# Patient Record
Sex: Male | Born: 1975 | Race: Black or African American | Hispanic: No | Marital: Married | Smoking: Never smoker
Health system: Southern US, Community
[De-identification: ages and names within clinical notes are randomized; demographics above are authoritative.]

## PROBLEM LIST (undated history)

## (undated) HISTORY — PX: NO PAST SURGERIES: SHX2092

## (undated) HISTORY — PX: INCISION AND DRAINAGE: SHX5863

---

## 2004-07-13 ENCOUNTER — Emergency Department: Payer: Self-pay | Admitting: Unknown Physician Specialty

## 2005-02-16 ENCOUNTER — Emergency Department: Payer: Self-pay | Admitting: Internal Medicine

## 2005-02-16 ENCOUNTER — Other Ambulatory Visit: Payer: Self-pay

## 2005-04-26 ENCOUNTER — Emergency Department: Payer: Self-pay | Admitting: Emergency Medicine

## 2010-08-03 ENCOUNTER — Ambulatory Visit: Payer: Self-pay | Admitting: Family Medicine

## 2014-02-25 ENCOUNTER — Ambulatory Visit (INDEPENDENT_AMBULATORY_CARE_PROVIDER_SITE_OTHER): Payer: Managed Care, Other (non HMO)

## 2014-02-25 ENCOUNTER — Ambulatory Visit (INDEPENDENT_AMBULATORY_CARE_PROVIDER_SITE_OTHER): Payer: Managed Care, Other (non HMO) | Admitting: Podiatry

## 2014-02-25 ENCOUNTER — Encounter: Payer: Self-pay | Admitting: Podiatry

## 2014-02-25 VITALS — BP 105/71 | HR 62 | Resp 16 | Ht 72.0 in | Wt 225.0 lb

## 2014-02-25 DIAGNOSIS — M7660 Achilles tendinitis, unspecified leg: Secondary | ICD-10-CM

## 2014-02-25 DIAGNOSIS — M7661 Achilles tendinitis, right leg: Secondary | ICD-10-CM

## 2014-02-25 MED ORDER — MELOXICAM 15 MG PO TABS
15.0000 mg | ORAL_TABLET | Freq: Every day | ORAL | Status: DC
Start: 2014-02-25 — End: 2017-07-05

## 2014-02-25 NOTE — Progress Notes (Signed)
   Subjective:    Patient ID: Dean Henderson, male    DOB: Dec 02, 1975, 38 y.o.   MRN: [id number]  HPI Comments: Its my right foot, it hurts up the back. The bottom does not hurt. Its been hurting for 1 month. Its about the same. It only hurts when i stretch. i am a runner but it does not hurt when running. i rest it and my wife will massage it for me.  Foot Pain      Review of Systems  Musculoskeletal:       Joint pain  All other systems reviewed and are negative.      Objective:   Physical Exam: I have reviewed his past medical history medications allergies surgeries social history and review of systems. Pulses are strongly palpable bilateral. Neurologic sensorium is intact per since once the monofilament. Deep tendon reflexes are intact bilateral muscle strength is 5 over 5 dorsiflexors plantar flexors inverters everters all intrinsic musculature is intact. The evaluation demonstrates all joints distal to the ankle a full range of motion without crepitation. He has pain on palpation to the Achilles tendon insertion site on the plantar lateral aspect of the right heel. Cutaneous evaluation demonstrates supple well hydrated cutis no erythema edema saline is drainage or odor. Radiographic evaluation does demonstrate a posterior probably oriented calcaneal heel spur.  Assessment: Tendo Achilles insertional tendinitis.  Plan: Discussed etiology pathology conservative versus surgical therapies. At this point I injected just lateral to his area of pain to his right foot. With dexamethasone and local anesthetic. Placed him in a night splint discussed appropriate shoe gear stretching exercises ice therapy and shoe gear modifications. He was scanned for a pair orthotics. Also wrote her prescription for Medrol Dosepak to be followed by meloxicam        Assessment & Plan:

## 2014-02-25 NOTE — Patient Instructions (Signed)

## 2014-03-04 ENCOUNTER — Encounter: Payer: Self-pay | Admitting: Podiatry

## 2014-03-04 NOTE — Progress Notes (Signed)
Patients orthotics have arrived

## 2014-03-05 ENCOUNTER — Telehealth: Payer: Self-pay | Admitting: Podiatry

## 2014-03-05 NOTE — Telephone Encounter (Signed)
Left message for Conny to schedule an appointment to pick up orthotics, also mailed postcard.

## 2014-03-18 ENCOUNTER — Ambulatory Visit: Payer: Managed Care, Other (non HMO) | Admitting: Podiatry

## 2014-03-25 ENCOUNTER — Ambulatory Visit (INDEPENDENT_AMBULATORY_CARE_PROVIDER_SITE_OTHER): Payer: Managed Care, Other (non HMO) | Admitting: Podiatry

## 2014-03-25 ENCOUNTER — Encounter: Payer: Self-pay | Admitting: Podiatry

## 2014-03-25 DIAGNOSIS — M7661 Achilles tendinitis, right leg: Secondary | ICD-10-CM

## 2014-03-25 DIAGNOSIS — M7660 Achilles tendinitis, unspecified leg: Secondary | ICD-10-CM

## 2014-03-25 NOTE — Progress Notes (Signed)
Pt presents for orthotic pick up ,written and verbal instructions are given  

## 2014-03-25 NOTE — Patient Instructions (Signed)

## 2014-12-30 DIAGNOSIS — E669 Obesity, unspecified: Secondary | ICD-10-CM | POA: Insufficient documentation

## 2015-10-20 LAB — HM HIV SCREENING LAB: HM HIV SCREENING: NEGATIVE

## 2017-07-04 ENCOUNTER — Encounter: Payer: Self-pay | Admitting: Physician Assistant

## 2017-07-04 ENCOUNTER — Ambulatory Visit: Payer: Commercial Managed Care - PPO | Admitting: Physician Assistant

## 2017-07-04 VITALS — BP 112/64 | HR 64 | Temp 98.6°F | Resp 16 | Ht 72.0 in | Wt 233.0 lb

## 2017-07-04 DIAGNOSIS — Z0000 Encounter for general adult medical examination without abnormal findings: Secondary | ICD-10-CM | POA: Diagnosis not present

## 2017-07-04 DIAGNOSIS — Z13220 Encounter for screening for lipoid disorders: Secondary | ICD-10-CM

## 2017-07-04 DIAGNOSIS — Z1329 Encounter for screening for other suspected endocrine disorder: Secondary | ICD-10-CM | POA: Diagnosis not present

## 2017-07-04 DIAGNOSIS — M7542 Impingement syndrome of left shoulder: Secondary | ICD-10-CM | POA: Diagnosis not present

## 2017-07-04 DIAGNOSIS — Z131 Encounter for screening for diabetes mellitus: Secondary | ICD-10-CM | POA: Diagnosis not present

## 2017-07-04 DIAGNOSIS — M19012 Primary osteoarthritis, left shoulder: Secondary | ICD-10-CM | POA: Diagnosis not present

## 2017-07-04 DIAGNOSIS — M952 Other acquired deformity of head: Secondary | ICD-10-CM

## 2017-07-04 MED ORDER — DICLOFENAC SODIUM 1 % TD GEL: 2.0000 g | Freq: Four times a day (QID) | TRANSDERMAL | 0 refills | Status: DC | PRN

## 2017-07-04 NOTE — Patient Instructions (Signed)

## 2017-07-04 NOTE — Progress Notes (Signed)
Patient: Dean Henderson, Male    DOB: October 25, 1975, 41 y.o.   MRN: [id number] Visit Date: 07/05/2017  Today's Provider: Trinna Post, PA-C   Chief Complaint  Patient presents with  . Establish Care  . Annual Exam   Subjective:    Annual physical exam Dean Henderson is a 41 y.o. male who presents today for health maintenance and complete physical.  Pt reports having left shoulder and hip pain.  Also right knee pain.  He feels fairly well. He reports exercising regularly. He reports he is sleeping fairly well.  He lives in [place], [place]. He has four children ages 38, 5, 30 and 64 who are doing well. He has been married the past 9 years. He has never smoked, doesn't use alcohol, doesn't use drugs. Exercises frequently with cardio. Owns a Administrator, [employer].   Declines flu shot.  He has multiple complaints related to joint pain and issues. He has been seen by orthopedics for right knee pain which was felt to be arthritis and probable tendinitis.   Reports pain in left shoulder, right fourth finger, and left hip. Has left shoulder pain when lifting arm. Has right fourth finger pain when holding his clippers. Has left hip pain when he gets up from crouching position.   He also has lump on his left forehead. Denies any head trauma to the area. Says it has been there for ten years. It has not grown much in size. Does not change sizes, get red, or leak fluid. Said a previous provider told him he could have it "filed down." It bothers him.  -----------------------------------------------------------------   Review of Systems  Constitutional: Negative.   HENT: Negative.   Eyes: Negative.   Respiratory: Negative.   Cardiovascular: Negative.   Gastrointestinal: Negative.   Endocrine: Negative.   Genitourinary: Negative.   Musculoskeletal: Negative.   Skin: Negative.   Allergic/Immunologic: Negative.   Neurological: Negative.   Hematological: Negative.   Psychiatric/Behavioral:  Negative.     Social History      He  reports that  has never smoked. he has never used smokeless tobacco. He reports that he does not drink alcohol or use drugs.       Social History   Socioeconomic History  . Marital status: Married    Spouse name: None  . Number of children: None  . Years of education: None  . Highest education level: None  Social Needs  . Financial resource strain: None  . Food insecurity - worry: None  . Food insecurity - inability: None  . Transportation needs - medical: None  . Transportation needs - non-medical: None  Occupational History  . None  Tobacco Use  . Smoking status: Never Smoker  . Smokeless tobacco: Never Used  Substance and Sexual Activity  . Alcohol use: No    Frequency: Never  . Drug use: No  . Sexual activity: None  Other Topics Concern  . None  Social History Narrative  . None    No past medical history on file.   Patient Active Problem List   Diagnosis Date Noted  . Annual physical exam 07/04/2017  . Screening cholesterol level 07/04/2017    Past Surgical History:  Procedure Laterality Date  . NO PAST SURGERIES      Family History        Family Status  Relation Name Status  . Mother  Alive  . Father  Alive  . PGM  (Not Specified)  His family history includes Diabetes in his father; Healthy in his mother; Hypertension in his father; Throat cancer in his paternal grandmother.     Allergies  Allergen Reactions  . Pork-Derived Products   . Tea      Current Outpatient Medications:  .  diclofenac sodium (VOLTAREN) 1 % GEL, Apply 2 g 4 (four) times daily as needed topically., Disp: 100 g, Rfl: 0   Patient Care Team: Paulene Floor as PCP - General (Physician Assistant)      Objective:   Vitals: BP 112/64 (BP Location: Left Arm, Patient Position: Sitting, Cuff Size: Large)   Pulse 64   Temp 98.6 F (37 C) (Oral)   Resp 16   Ht 6' (1.829 m)   Wt 233 lb (105.7 kg)   BMI 31.60 kg/m     Vitals:   07/04/17 1415  BP: 112/64  Pulse: 64  Resp: 16  Temp: 98.6 F (37 C)  TempSrc: Oral  Weight: 233 lb (105.7 kg)  Height: 6' (1.829 m)     Physical Exam  Constitutional: He is oriented to person, place, and time. He appears well-developed and well-nourished.  HENT:  Right Ear: Tympanic membrane and external ear normal.  Left Ear: Tympanic membrane and external ear normal.  Nose: Nose normal.  Mouth/Throat: Oropharynx is clear and moist. No oropharyngeal exudate.  Eyes: Conjunctivae are normal.  Neck: Neck supple.  Cardiovascular: Normal rate and regular rhythm.  Pulmonary/Chest: Effort normal and breath sounds normal.  Abdominal: Soft. Bowel sounds are normal.  Musculoskeletal:  Shoulders normal strength bilaterally. L shoulder with some pain on Neer's test. Right fourth finger with some tenderness to palpation. No swelling or bogginess in digits.   Lymphadenopathy:    He has no cervical adenopathy.  Neurological: He is alert and oriented to person, place, and time.  Skin: Skin is warm and dry.  There is a small 1 cm in diameter lump on left aspect of forehead. It is not fluctuant like a cyst or lipoma. It is hard and immobile, feels like bone.   Psychiatric: He has a normal mood and affect. His behavior is normal.     Depression Screen No flowsheet data found.    Assessment & Plan:     Routine Health Maintenance and Physical Exam  Exercise Activities and Dietary recommendations Goals    None      Immunization History  Administered Date(s) Administered  . Tdap 06/24/2008    Health Maintenance  Topic Date Due  . HIV Screening  04/13/1992  . INFLUENZA VACCINE  11/20/2017 (Originally 03/23/2017)  . TETANUS/TDAP  06/24/2018     Discussed health benefits of physical activity, and encouraged him to engage in regular exercise appropriate for his age and condition.    1. Annual physical exam  - CBC with Differential - Ambulatory referral to  General Surgery  2. Screening cholesterol level  - Lipid Profile  3. Diabetes mellitus screening  - Comprehensive Metabolic Panel (CMET)  4. Thyroid disorder screening  - TSH  5. Impingement syndrome of left shoulder  Printed out exercises.   6. Osteoarthritis of left shoulder, unspecified osteoarthritis type  Many of his complaints consistent with arthritis. Talked about Tylenol, low impact physical activity like biking, swimming, yoga. Voltaren gel worked well for him in the past. Use PRN.   - diclofenac sodium (VOLTAREN) 1 % GEL; Apply 2 g 4 (four) times daily as needed topically.  Dispense: 100 g; Refill: 0  7. Skull deformity  Unsure what this is, may be remodeling to forgotten past trauma. It appears to have been stable x 10 years but it concerns patient. Refer to surgery for further eval.  Return in about 1 year (around 07/04/2018) for CPE.  The entirety of the information documented in the History of Present Illness, Review of Systems and Physical Exam were personally obtained by me. Portions of this information were initially documented by April M. Sabra Heck, CMA and reviewed by me for thoroughness and accuracy.   --------------------------------------------------------------------    Trinna Post, PA-C  [facility]

## 2017-07-06 ENCOUNTER — Encounter: Payer: Self-pay | Admitting: Physician Assistant

## 2017-07-19 ENCOUNTER — Telehealth: Payer: Self-pay

## 2017-07-19 LAB — LIPID PANEL
Cholesterol: 136 mg/dL (ref <200)
HDL: 68 mg/dL (ref >40)
LDL Cholesterol (Calc): 57 mg/dL (calc)
Non-HDL Cholesterol (Calc): 68 mg/dL (calc) (ref <130)
Total CHOL/HDL Ratio: 2 (calc) (ref <5.0)
Triglycerides: 37 mg/dL (ref <150)

## 2017-07-19 LAB — CBC WITH DIFFERENTIAL/PLATELET
Basophils Absolute: 31 cells/uL (ref 0–200)
Basophils Relative: 0.4 %
Eosinophils Absolute: 77 cells/uL (ref 15–500)
Eosinophils Relative: 1 %
HCT: 43.4 % (ref 38.5–50.0)
Hemoglobin: 14.6 g/dL (ref 13.2–17.1)
Lymphs Abs: 2010 cells/uL (ref 850–3900)
MCH: 29.8 pg (ref 27.0–33.0)
MCHC: 33.6 g/dL (ref 32.0–36.0)
MCV: 88.6 fL (ref 80.0–100.0)
MPV: 10.3 fL (ref 7.5–12.5)
Monocytes Relative: 3.6 %
Neutro Abs: 5305 cells/uL (ref 1500–7800)
Neutrophils Relative %: 68.9 %
Platelets: 224 10*3/uL (ref 140–400)
RBC: 4.9 10*6/uL (ref 4.20–5.80)
RDW: 11.6 % (ref 11.0–15.0)
Total Lymphocyte: 26.1 %
WBC mixed population: 277 cells/uL (ref 200–950)
WBC: 7.7 10*3/uL (ref 3.8–10.8)

## 2017-07-19 LAB — COMPLETE METABOLIC PANEL WITH GFR
AG Ratio: 1.4 (calc) (ref 1.0–2.5)
ALT: 25 U/L (ref 9–46)
AST: 21 U/L (ref 10–40)
Albumin: 4.4 g/dL (ref 3.6–5.1)
Alkaline phosphatase (APISO): 58 U/L (ref 40–115)
BUN: 12 mg/dL (ref 7–25)
CO2: 31 mmol/L (ref 20–32)
Calcium: 9.1 mg/dL (ref 8.6–10.3)
Chloride: 103 mmol/L (ref 98–110)
Creat: 0.96 mg/dL (ref 0.60–1.35)
GFR, Est African American: 114 mL/min/{1.73_m2} (ref >=60)
GFR, Est Non African American: 98 mL/min/{1.73_m2} (ref >=60)
Globulin: 3.2 g/dL (calc) (ref 1.9–3.7)
Glucose, Bld: 94 mg/dL (ref 65–99)
Potassium: 4.6 mmol/L (ref 3.5–5.3)
Sodium: 138 mmol/L (ref 135–146)
Total Bilirubin: 1.5 mg/dL — ABNORMAL HIGH (ref 0.2–1.2)
Total Protein: 7.6 g/dL (ref 6.1–8.1)

## 2017-07-19 LAB — TSH: TSH: 1.06 mIU/L (ref 0.40–4.50)

## 2017-07-19 NOTE — Telephone Encounter (Signed)
LMTCB 07/19/2017  Thanks,   -Mickel Baas

## 2017-07-19 NOTE — Telephone Encounter (Signed)
-----   Message from Trinna Post, [place] sent at 07/19/2017 11:52 AM EST ----- Labwork all normal. Have filled out form and it is ready for pickup. Please have wife direct all further communication through clinic calls or MyChart.

## 2017-07-20 ENCOUNTER — Telehealth: Payer: Self-pay | Admitting: Physician Assistant

## 2017-07-20 NOTE — Telephone Encounter (Signed)
Spoke with Dean Henderson about his work form being ready to pick up. I have also informed him that the appropriate means of him and his wife contacting this clinic includes calling the phone line and MyChart. He expresses understanding. Says he will pick up the form later.

## 2017-07-20 NOTE — Telephone Encounter (Signed)
Pt advised.   Thanks,   -Dyonna Jaspers  

## 2017-08-01 ENCOUNTER — Ambulatory Visit: Payer: Commercial Managed Care - PPO | Admitting: Surgery

## 2017-09-05 ENCOUNTER — Encounter: Payer: Self-pay | Admitting: Surgery

## 2017-09-05 ENCOUNTER — Ambulatory Visit (INDEPENDENT_AMBULATORY_CARE_PROVIDER_SITE_OTHER): Payer: Commercial Managed Care - PPO | Admitting: Surgery

## 2017-09-05 VITALS — BP 118/74 | HR 60 | Temp 97.9°F | Ht 72.0 in | Wt 233.0 lb

## 2017-09-05 DIAGNOSIS — M898X8 Other specified disorders of bone, other site: Secondary | ICD-10-CM

## 2017-09-05 DIAGNOSIS — M899 Disorder of bone, unspecified: Secondary | ICD-10-CM | POA: Diagnosis not present

## 2017-09-05 NOTE — Patient Instructions (Addendum)
INSTRUCTIONS FOR MRI of Skull  Location: [facility]  Date and Time: Monday at 8:00AM Please arrive at 7:30AM check in at the Radiology Desk.  Instructions:  Nothing to eat or drink after midnight the day before your procedure. Bring a list of medications with you to your testing appointment.   If you need to change the appointment, please call central scheduling at [phone].

## 2017-09-05 NOTE — Progress Notes (Signed)
  Surgical Consultation  09/05/2017  Dean Henderson is an 42 y.o. male.   Chief Complaint  Patient presents with  . New Patient (Initial Visit)    new patient Skull deformity referred Dr. Carles Collet     HPI: Pt seen in consultation at the request of Mrs. Pollak PA for skull lesion. He reports she has had this frontal lesion for last 5-6 years. He does not recall any previous trauma. No pain, no significant growth. No symptoms related to the nodule. No neurological symptoms He Is able to perform more than 6 Mets of activity without any shortness of breath or chest pain. CBC and CMP reviewed and are nml. No images are  available.  History reviewed. No pertinent past medical history.  Past Surgical History:  Procedure Laterality Date  . NO PAST SURGERIES      Family History  Problem Relation Age of Onset  . Healthy Mother   . Diabetes Father   . Hypertension Father   . Throat cancer Paternal Grandmother   . Cancer Paternal Grandmother   . Cancer Paternal Uncle     Social History:  reports that  has never smoked. he has never used smokeless tobacco. He reports that he does not drink alcohol or use drugs.  Allergies:  Allergies  Allergen Reactions  . Pork-Derived Products   . Tea     Medications reviewed.     ROS Full ROS performed and is otherwise negative other than what is stated in the HPI    BP 118/74   Pulse 60   Temp 97.9 F (36.6 C) (Oral)   Ht 6' (1.829 m)   Wt 105.7 kg (233 lb)   BMI 31.60 kg/m   Physical Exam  Constitutional: He is oriented to person, place, and time and well-developed, well-nourished, and in no distress. No distress.  Eyes: Conjunctivae are normal. Right eye exhibits no discharge. Left eye exhibits no discharge. No scleral icterus.  Neck: No JVD present.  There is a 1.5 cm hard nodule on his left lateral frontal bone. Non mobile, non tender, non fluctuant. No evidence of infection  Pulmonary/Chest: Effort normal. No  stridor. No respiratory distress.  Neurological: He is alert and oriented to person, place, and time. No cranial nerve deficit. Gait normal. GCS score is 15.  Skin: Skin is warm.  Psychiatric: Mood, memory, affect and judgment normal.  Nursing note and vitals reviewed.    Assessment/Plan: Skull nodule. Plan for Ct scan skull and RTC 2 weeks. IF it is c/w neoplasm may need referral to NS. Currently no need for emergent interventions at this time.  Caroleen Hamman, MD [facility] General Surgeon

## 2017-09-12 ENCOUNTER — Ambulatory Visit: Admission: RE | Admit: 2017-09-12 | Payer: Commercial Managed Care - PPO | Source: Ambulatory Visit

## 2017-09-21 ENCOUNTER — Telehealth: Payer: Self-pay

## 2017-09-21 NOTE — Telephone Encounter (Addendum)
Call made to patient at this time. I asked about his MRI that he cancelled and he advised that he will have the completed closer to the summer and that I could go ahead and cancel his appointment to be seen by Dr. Dahlia Byes in February. HE stated that he will call when he is ready to reschedule this appointment. I verbalized understanding.

## 2017-10-10 ENCOUNTER — Ambulatory Visit: Payer: Self-pay | Admitting: Surgery

## 2017-11-21 ENCOUNTER — Ambulatory Visit: Payer: Self-pay | Admitting: Surgery

## 2017-11-28 ENCOUNTER — Telehealth: Payer: Self-pay

## 2017-11-28 ENCOUNTER — Ambulatory Visit
Admission: RE | Admit: 2017-11-28 | Discharge: 2017-11-28 | Disposition: A | Discharge status: Home / Self Care | Payer: Commercial Managed Care - PPO | Source: Ambulatory Visit | Attending: Surgery | Admitting: Surgery

## 2017-11-28 DIAGNOSIS — M898X8 Other specified disorders of bone, other site: Secondary | ICD-10-CM

## 2017-11-28 DIAGNOSIS — M899 Disorder of bone, unspecified: Secondary | ICD-10-CM | POA: Insufficient documentation

## 2017-11-28 DIAGNOSIS — R930 Abnormal findings on diagnostic imaging of skull and head, not elsewhere classified: Secondary | ICD-10-CM | POA: Insufficient documentation

## 2017-11-28 NOTE — Telephone Encounter (Signed)
Called patient to tell him his MRI results per Dr. Corlis Leak request. I also told patient that we would need to refer him to a neurosurgeon. I asked patient if he preferred going to [facility], [facility] [place] or [facility]. Patient requested [facility]. I told him that I would send the referral and that they would call him with date and time. Patient understood and had no further questions.

## 2017-11-28 NOTE — Telephone Encounter (Signed)
-----   Message from Jules Husbands, MD sent at 11/28/2017 12:01 PM EDT ----- Please let the pt know that the MRI shows a questionable benign lesion but I will have to refer him to neurosurgery. Please make appt for Neurosurgery referral. thanks ----- Message ----- From: Interface, Rad Results In Sent: 11/28/2017   9:33 AM To: Jules Husbands, MD

## 2017-12-05 ENCOUNTER — Ambulatory Visit: Payer: Self-pay | Admitting: Surgery

## 2017-12-12 ENCOUNTER — Telehealth: Payer: Self-pay

## 2017-12-12 NOTE — Telephone Encounter (Signed)
Left message for patient to return call to office regarding appointment at [facility]? Date?  Unable to see in care everywhere.

## 2017-12-26 DIAGNOSIS — M899 Disorder of bone, unspecified: Secondary | ICD-10-CM | POA: Insufficient documentation

## 2018-07-10 ENCOUNTER — Ambulatory Visit (INDEPENDENT_AMBULATORY_CARE_PROVIDER_SITE_OTHER): Payer: Commercial Managed Care - PPO | Admitting: Physician Assistant

## 2018-07-10 ENCOUNTER — Encounter: Payer: Self-pay | Admitting: Physician Assistant

## 2018-07-10 ENCOUNTER — Telehealth: Payer: Self-pay | Admitting: Physician Assistant

## 2018-07-10 VITALS — BP 112/86 | HR 60 | Temp 97.8°F | Resp 16 | Ht 72.0 in | Wt 228.0 lb

## 2018-07-10 DIAGNOSIS — Z13220 Encounter for screening for lipoid disorders: Secondary | ICD-10-CM

## 2018-07-10 DIAGNOSIS — Z23 Encounter for immunization: Secondary | ICD-10-CM | POA: Diagnosis not present

## 2018-07-10 DIAGNOSIS — D164 Benign neoplasm of bones of skull and face: Secondary | ICD-10-CM | POA: Diagnosis not present

## 2018-07-10 DIAGNOSIS — Z0000 Encounter for general adult medical examination without abnormal findings: Secondary | ICD-10-CM

## 2018-07-10 DIAGNOSIS — Z1329 Encounter for screening for other suspected endocrine disorder: Secondary | ICD-10-CM

## 2018-07-10 DIAGNOSIS — Z131 Encounter for screening for diabetes mellitus: Secondary | ICD-10-CM

## 2018-07-10 DIAGNOSIS — Z130 Encounter for screening for diseases of the blood and blood-forming organs and certain disorders involving the immune mechanism: Secondary | ICD-10-CM

## 2018-07-10 NOTE — Patient Instructions (Signed)

## 2018-07-10 NOTE — Telephone Encounter (Signed)
Pt's wife will be faxing over a form to be filled out at his physical today.  Pt couldn't find the one that was given. Asking if this could be taken off and ready at pt's CPE today at 2 pm.  Thanks, [facility]

## 2018-07-10 NOTE — Progress Notes (Signed)
Patient: Dean Henderson, Male    DOB: 1976/07/07, 42 y.o.   MRN: [id number] Visit Date: 07/10/2018  Today's Provider: Trinna Post, PA-C   Chief Complaint  Patient presents with  . Annual Exam   Subjective:    Annual physical exam Dean Henderson is a 42 y.o. male who presents today for health maintenance and complete physical. He feels well. He reports exercising 4 days a week. He reports he is sleeping well. He is still working at [employer]. He reports is family is doing well. Denies family history of colon or prostate cancer. He denies smoking or drugs.   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 Follow up note on 07/03/2018 dictates that he does not need any further follow up. Reports he did well with this surgery.  Has clobetasol cream for his keloid scars that he uses PRN. Otherwise is on no medication.  BP Readings from Last 3 Encounters:  07/10/18 112/86  09/05/17 118/74  07/04/17 112/64   Wt Readings from Last 3 Encounters:  07/10/18 228 lb (103.4 kg)  09/05/17 233 lb (105.7 kg)  07/04/17 233 lb (105.7 kg)    -----------------------------------------------------------------   Review of Systems  Constitutional: Negative.   HENT: Negative.   Eyes: Negative.   Respiratory: Negative.   Cardiovascular: Negative.   Gastrointestinal: Negative.   Endocrine: Negative.   Genitourinary: Negative.   Musculoskeletal: Negative.   Skin: Negative.   Allergic/Immunologic: Negative.   Neurological: Negative.   Hematological: Negative.   Psychiatric/Behavioral: Negative.     Social History      He  reports that he has never smoked. He has never used smokeless tobacco. He reports that he does not drink alcohol or use drugs.       Social History   Socioeconomic History  . Marital status: Married    Spouse name: Not on file  . Number of children: Not on file  . Years of education: Not on file  . Highest  education level: Not on file  Occupational History  . Not on file  Social Needs  . Financial resource strain: Not on file  . Food insecurity:    Worry: Not on file    Inability: Not on file  . Transportation needs:    Medical: Not on file    Non-medical: Not on file  Tobacco Use  . Smoking status: Never Smoker  . Smokeless tobacco: Never Used  Substance and Sexual Activity  . Alcohol use: No    Frequency: Never  . Drug use: No  . Sexual activity: Not on file  Lifestyle  . Physical activity:    Days per week: Not on file    Minutes per session: Not on file  . Stress: Not on file  Relationships  . Social connections:    Talks on phone: Not on file    Gets together: Not on file    Attends religious service: Not on file    Active member of club or organization: Not on file    Attends meetings of clubs or organizations: Not on file    Relationship status: Not on file  Other Topics Concern  . Not on file  Social History Narrative  . Not on file    No past medical history on file.   Patient Active Problem List   Diagnosis Date Noted  . 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 Annual  physical exam 07/04/2017  . Screening cholesterol level 07/04/2017  . Hyperbilirubinemia 04/07/2015  . Obesity, Class I, BMI 30-34.9 12/30/2014    Past Surgical History:  Procedure Laterality Date  . NO PAST SURGERIES      Family History        Family Status  Relation Name Status  . Mother  Alive  . Father  Alive  . PGM  (Not Specified)  . Annamarie Major  (Not Specified)        His family history includes Cancer in his paternal grandmother and paternal uncle; Diabetes in his father; Healthy in his mother; Hypertension in his father; Throat cancer in his paternal grandmother.      Allergies  Allergen Reactions  . Other Hives    Tea  . Pork-Derived Products   . Tea      Current Outpatient Medications:  .  clobetasol cream (TEMOVATE) 0.05 %, APPLY TO AFFECTED AREA TWICE A DAY, Disp: ,  Rfl:    Patient Care Team: Paulene Floor as PCP - General (Physician Assistant)      Objective:   Vitals: BP 112/86 (BP Location: Left Arm, Patient Position: Sitting, Cuff Size: Normal)   Pulse 60   Temp 97.8 F (36.6 C) (Oral)   Resp 16   Ht 6' (1.829 m)   Wt 228 lb (103.4 kg)   SpO2 98%   BMI 30.92 kg/m    Vitals:   07/10/18 1408  BP: 112/86  Pulse: 60  Resp: 16  Temp: 97.8 F (36.6 C)  TempSrc: Oral  SpO2: 98%  Weight: 228 lb (103.4 kg)  Height: 6' (1.829 m)     Physical Exam   Depression Screen PHQ 2/9 Scores 07/10/2018 07/06/2017 07/06/2017  PHQ - 2 Score 0 0 0  PHQ- 9 Score 0 - 2      Assessment & Plan:     Routine Health Maintenance and Physical Exam  Exercise Activities and Dietary recommendations Goals   None     Immunization History  Administered Date(s) Administered  . Influenza, Seasonal, Injecte, Preservative Fre 06/24/2008, 06/23/2009  . Td 07/10/2018  . Tdap 06/24/2008    Health Maintenance  Topic Date Due  . INFLUENZA VACCINE  03/23/2018  . TETANUS/TDAP  06/24/2018  . HIV Screening  Completed     Discussed health benefits of physical activity, and encouraged him to engage in regular exercise appropriate for his age and condition.    1. Annual physical exam  Doing well, routine labs as below and will fill out employment form.  2. Osteoma of skull  This was removed by [facility] in 02/2018 and pathology was benign. No follow up needed unless he has return of growth or issues with incision, per the neurosurgery note which I have reviewed in Care Everywhere.   3. Need for tetanus booster  - Td vaccine greater than or equal to 7yo preservative free IM  4. Thyroid disorder screening  - TSH  5. Screening cholesterol level  - Lipid Panel With LDL/HDL Ratio  6. Diabetes mellitus screening  - Comprehensive metabolic panel  7. Screening for deficiency anemia  - CBC with Differential/Platelet  Return in about 1  year (around 07/11/2019) for CPE.  The entirety of the information documented in the History of Present Illness, Review of Systems and Physical Exam were personally obtained by me. Portions of this information were initially documented by Lynford Humphrey, CMA and reviewed by me for thoroughness and accuracy.   --------------------------------------------------------------------  Trinna Post, PA-C  [facility]

## 2018-07-11 LAB — CBC WITH DIFFERENTIAL/PLATELET
Basophils Absolute: 0.1 10*3/uL (ref 0.0–0.2)
Basos: 1 %
EOS (ABSOLUTE): 0.1 10*3/uL (ref 0.0–0.4)
Eos: 1 %
Hematocrit: 40.9 % (ref 37.5–51.0)
Hemoglobin: 13.8 g/dL (ref 13.0–17.7)
Immature Grans (Abs): 0 10*3/uL (ref 0.0–0.1)
Immature Granulocytes: 0 %
Lymphocytes Absolute: 4.2 10*3/uL — ABNORMAL HIGH (ref 0.7–3.1)
Lymphs: 37 %
MCH: 29.6 pg (ref 26.6–33.0)
MCHC: 33.7 g/dL (ref 31.5–35.7)
MCV: 88 fL (ref 79–97)
Monocytes Absolute: 0.6 10*3/uL (ref 0.1–0.9)
Monocytes: 5 %
Neutrophils Absolute: 6.4 10*3/uL (ref 1.4–7.0)
Neutrophils: 56 %
Platelets: 239 10*3/uL (ref 150–450)
RBC: 4.67 x10E6/uL (ref 4.14–5.80)
RDW: 11.5 % — ABNORMAL LOW (ref 12.3–15.4)
WBC: 11.3 10*3/uL — ABNORMAL HIGH (ref 3.4–10.8)

## 2018-07-11 LAB — LIPID PANEL WITH LDL/HDL RATIO
Cholesterol, Total: 138 mg/dL (ref 100–199)
HDL: 63 mg/dL (ref >39)
LDL Calculated: 68 mg/dL (ref 0–99)
LDl/HDL Ratio: 1.1 ratio (ref 0.0–3.6)
Triglycerides: 35 mg/dL (ref 0–149)
VLDL Cholesterol Cal: 7 mg/dL (ref 5–40)

## 2018-07-11 LAB — COMPREHENSIVE METABOLIC PANEL
ALT: 23 IU/L (ref 0–44)
AST: 27 IU/L (ref 0–40)
Albumin/Globulin Ratio: 1.7 (ref 1.2–2.2)
Albumin: 4.5 g/dL (ref 3.5–5.5)
Alkaline Phosphatase: 69 IU/L (ref 39–117)
BUN/Creatinine Ratio: 15 (ref 9–20)
BUN: 17 mg/dL (ref 6–24)
Bilirubin Total: 1.3 mg/dL — ABNORMAL HIGH (ref 0.0–1.2)
CO2: 22 mmol/L (ref 20–29)
Calcium: 9.3 mg/dL (ref 8.7–10.2)
Chloride: 98 mmol/L (ref 96–106)
Creatinine, Ser: 1.14 mg/dL (ref 0.76–1.27)
GFR calc Af Amer: 92 mL/min/{1.73_m2} (ref >59)
GFR calc non Af Amer: 79 mL/min/{1.73_m2} (ref >59)
Globulin, Total: 2.7 g/dL (ref 1.5–4.5)
Glucose: 81 mg/dL (ref 65–99)
Potassium: 4.2 mmol/L (ref 3.5–5.2)
Sodium: 138 mmol/L (ref 134–144)
Total Protein: 7.2 g/dL (ref 6.0–8.5)

## 2018-07-11 LAB — TSH: TSH: 1.23 u[IU]/mL (ref 0.450–4.500)

## 2018-07-13 ENCOUNTER — Telehealth: Payer: Self-pay

## 2018-07-13 NOTE — Telephone Encounter (Signed)
Pt's wife called to make sure pt's Biometric form was faxed to the number on the form.  Contact [phone]  Thanks,   -Mickel Baas

## 2018-07-13 NOTE — Telephone Encounter (Signed)
Form faxed

## 2018-09-25 DIAGNOSIS — L910 Hypertrophic scar: Secondary | ICD-10-CM | POA: Diagnosis not present

## 2018-09-25 DIAGNOSIS — B350 Tinea barbae and tinea capitis: Secondary | ICD-10-CM | POA: Diagnosis not present

## 2018-10-18 DIAGNOSIS — L910 Hypertrophic scar: Secondary | ICD-10-CM | POA: Diagnosis not present

## 2018-10-18 DIAGNOSIS — B350 Tinea barbae and tinea capitis: Secondary | ICD-10-CM | POA: Diagnosis not present
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
## 2019-07-16 ENCOUNTER — Ambulatory Visit (INDEPENDENT_AMBULATORY_CARE_PROVIDER_SITE_OTHER): Payer: Commercial Managed Care - PPO | Admitting: Physician Assistant

## 2019-07-16 ENCOUNTER — Encounter: Payer: Self-pay | Admitting: Physician Assistant

## 2019-07-16 ENCOUNTER — Other Ambulatory Visit: Payer: Self-pay

## 2019-07-16 VITALS — BP 123/79 | HR 60 | Temp 96.8°F | Ht 72.0 in | Wt 228.0 lb

## 2019-07-16 DIAGNOSIS — Z0000 Encounter for general adult medical examination without abnormal findings: Secondary | ICD-10-CM | POA: Diagnosis not present

## 2019-07-16 DIAGNOSIS — M25561 Pain in right knee: Secondary | ICD-10-CM | POA: Diagnosis not present

## 2019-07-16 DIAGNOSIS — M25552 Pain in left hip: Secondary | ICD-10-CM | POA: Diagnosis not present

## 2019-07-16 NOTE — Patient Instructions (Addendum)
Call Dr. Rudene Christians for knee pain   Health Maintenance, Male Adopting a healthy lifestyle and getting preventive care are important in promoting health and wellness. Ask your health care provider about:  The right schedule for you to have regular tests and exams.  Things you can do on your own to prevent diseases and keep yourself healthy. What should I know about diet, weight, and exercise? Eat a healthy diet   Eat a diet that includes plenty of vegetables, fruits, low-fat dairy products, and lean protein.  Do not eat a lot of foods that are high in solid fats, added sugars, or sodium. Maintain a healthy weight Body mass index (BMI) is a measurement that can be used to identify possible weight problems. It estimates body fat based on height and weight. Your health care provider can help determine your BMI and help you achieve or maintain a healthy weight. Get regular exercise Get regular exercise. This is one of the most important things you can do for your health. Most adults should:  Exercise for at least 150 minutes each week. The exercise should increase your heart rate and make you sweat (moderate-intensity exercise).  Do strengthening exercises at least twice a week. This is in addition to the moderate-intensity exercise.  Spend less time sitting. Even light physical activity can be beneficial. Watch cholesterol and blood lipids Have your blood tested for lipids and cholesterol at 42 years of age, then have this test every 5 years. You may need to have your cholesterol levels checked more often if:  Your lipid or cholesterol levels are high.  You are older than 42 years of age.  You are at high risk for heart disease. What should I know about cancer screening? Many types of cancers can be detected early and may often be prevented. Depending on your health history and family history, you may need to have cancer screening at various ages. This may include screening for:   Colorectal cancer.  Prostate cancer.  Skin cancer.  Lung cancer. What should I know about heart disease, diabetes, and high blood pressure? Blood pressure and heart disease  High blood pressure causes heart disease and increases the risk of stroke. This is more likely to develop in people who have high blood pressure readings, are of African descent, or are overweight.  Talk with your health care provider about your target blood pressure readings.  Have your blood pressure checked: ? Every 3-5 years if you are 42-27 years of age. ? Every year if you are 42 years old or older.  If you are between the ages of 42 and 42 and are a current or former smoker, ask your health care provider if you should have a one-time screening for abdominal aortic aneurysm (AAA). Diabetes Have regular diabetes screenings. This checks your fasting blood sugar level. Have the screening done:  Once every three years after age 42 if you are at a normal weight and have a low risk for diabetes.  More often and at a younger age if you are overweight or have a high risk for diabetes. What should I know about preventing infection? Hepatitis B If you have a higher risk for hepatitis B, you should be screened for this virus. Talk with your health care provider to find out if you are at risk for hepatitis B infection. Hepatitis C Blood testing is recommended for:  Everyone born from [birth year] through 1964.  Anyone with known risk factors for hepatitis C. Sexually transmitted infections (  STIs)  You should be screened each year for STIs, including gonorrhea and chlamydia, if: ? You are sexually active and are younger than 42 years of age. ? You are older than 42 years of age and your health care provider tells you that you are at risk for this type of infection. ? Your sexual activity has changed since you were last screened, and you are at increased risk for chlamydia or gonorrhea. Ask your health care provider if you  are at risk.  Ask your health care provider about whether you are at high risk for HIV. Your health care provider may recommend a prescription medicine to help prevent HIV infection. If you choose to take medicine to prevent HIV, you should first get tested for HIV. You should then be tested every 3 months for as long as you are taking the medicine. Follow these instructions at home: Lifestyle  Do not use any products that contain nicotine or tobacco, such as cigarettes, e-cigarettes, and chewing tobacco. If you need help quitting, ask your health care provider.  Do not use street drugs.  Do not share needles.  Ask your health care provider for help if you need support or information about quitting drugs. Alcohol use  Do not drink alcohol if your health care provider tells you not to drink.  If you drink alcohol: ? Limit how much you have to 0-2 drinks a day. ? Be aware of how much alcohol is in your drink. In the [place] one drink equals one 12 oz bottle of beer (355 mL), one 5 oz glass of wine (148 mL), or one 1 oz glass of hard liquor (44 mL). General instructions  Schedule regular health, dental, and eye exams.  Stay current with your vaccines.  Tell your health care provider if: ? You often feel depressed. ? You have ever been abused or do not feel safe at home. Summary  Adopting a healthy lifestyle and getting preventive care are important in promoting health and wellness.  Follow your health care provider's instructions about healthy diet, exercising, and getting tested or screened for diseases.  Follow your health care provider's instructions on monitoring your cholesterol and blood pressure. This information is not intended to replace advice given to you by your health care provider. Make sure you discuss any questions you have with your health care provider. Document Released: 02/05/2008 Document Revised: 08/02/2018 Document Reviewed: 08/02/2018 Elsevier Patient  Education  2020 [employer].

## 2019-07-16 NOTE — Progress Notes (Signed)
Patient: Dean Henderson, Male    DOB: 1976-06-09, 43 y.o.   MRN: [id number] Visit Date: 07/16/2019  Today's Provider: Trinna Post, PA-C   Chief Complaint  Patient presents with  . Annual Exam   Subjective:    Annual physical exam Dean Henderson is a 43 y.o. male who presents today for health maintenance and complete physical. He feels well. He reports exercising running 3 times a week. He reports he is sleeping fairly well.  Running for exercise. Saw [facility] for right knee pain last year. Told he has some arthritic changes and may need to stop running. Thinks he may need to go back.  -----------------------------------------------------------------   Review of Systems  Constitutional: Negative.   HENT: Negative.   Eyes: Negative.   Respiratory: Negative.   Cardiovascular: Negative.   Gastrointestinal: Negative.   Endocrine: Negative.   Genitourinary: Negative.   Musculoskeletal: Negative.   Skin: Negative.   Allergic/Immunologic: Negative.   Neurological: Negative.   Hematological: Negative.   Psychiatric/Behavioral: Negative.     Social History He  reports that he has never smoked. He has never used smokeless tobacco. He reports that he does not drink alcohol or use drugs. Social History   Socioeconomic History  . Marital status: Married    Spouse name: Not on file  . Number of children: Not on file  . Years of education: Not on file  . Highest education level: Not on file  Occupational History  . Not on file  Social Needs  . Financial resource strain: Not on file  . Food insecurity    Worry: Not on file    Inability: Not on file  . Transportation needs    Medical: Not on file    Non-medical: Not on file  Tobacco Use  . Smoking status: Never Smoker  . Smokeless tobacco: Never Used  Substance and Sexual Activity  . Alcohol use: No    Frequency: Never  . Drug use: No  . Sexual activity: Not on file  Lifestyle  . Physical  activity    Days per week: Not on file    Minutes per session: Not on file  . Stress: Not on file  Relationships  . Social Herbalist on phone: Not on file    Gets together: Not on file    Attends religious service: Not on file    Active member of club or organization: Not on file    Attends meetings of clubs or organizations: Not on file    Relationship status: Not on file  Other Topics Concern  . Not on file  Social History Narrative  . Not on file    Patient Active Problem List   Diagnosis Date Noted  . 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 Skull lesion 12/26/2017  . Annual physical exam 07/04/2017  . Screening cholesterol level 07/04/2017  . Hyperbilirubinemia 04/07/2015  . Obesity, Class I, BMI 30-34.9 12/30/2014    Past Surgical History:  Procedure Laterality Date  . NO PAST SURGERIES      Family History  Family Status  Relation Name Status  . Mother  Alive  . Father  Alive  . PGM  (Not Specified)  . Annamarie Major  (Not Specified)   His family history includes Cancer in his paternal grandmother and paternal uncle; Diabetes in his father; Healthy in his mother; Hypertension in his father; Throat cancer in his paternal grandmother.  Allergies  Allergen Reactions  . Other Hives    Tea  . Pork-Derived Products   . Tea     Previous Medications   CLOBETASOL CREAM (TEMOVATE) 0.05 %    APPLY TO AFFECTED AREA TWICE A DAY    Patient Care Team: Paulene Floor as PCP - General (Physician Assistant)      Objective:   Vitals: BP 123/79 (BP Location: Left Arm, Patient Position: Sitting, Cuff Size: Large)   Pulse 60   Temp (!) 96.8 F (36 C) (Temporal)   Ht 6' (1.829 m)   Wt 228 lb (103.4 kg)   BMI 30.92 kg/m    Physical Exam Constitutional:      Appearance: Normal appearance.  Cardiovascular:     Rate and Rhythm: Normal rate and regular rhythm.     Heart sounds: Normal heart sounds.  Pulmonary:     Breath sounds: Normal breath sounds.    Abdominal:     General: Abdomen is flat. Bowel sounds are normal.     Palpations: Abdomen is soft.  Skin:    General: Skin is warm and dry.  Neurological:     Mental Status: He is alert and oriented to person, place, and time. Mental status is at baseline.  Psychiatric:        Mood and Affect: Mood normal.        Behavior: Behavior normal.      Depression Screen PHQ 2/9 Scores 07/10/2018 07/06/2017 07/06/2017  PHQ - 2 Score 0 0 0  PHQ- 9 Score 0 - 2      Assessment & Plan:     Routine Health Maintenance and Physical Exam  Exercise Activities and Dietary recommendations Goals   None     Immunization History  Administered Date(s) Administered  . Influenza, Seasonal, Injecte, Preservative Fre 06/24/2008, 06/23/2009  . Td 07/10/2018  . Tdap 06/24/2008    Health Maintenance  Topic Date Due  . INFLUENZA VACCINE  11/21/2019 (Originally 03/24/2019)  . TETANUS/TDAP  07/10/2028  . HIV Screening  Completed     Discussed health benefits of physical activity, and encouraged him to engage in regular exercise appropriate for his age and condition.    1. Annual physical exam  Will fill out work CPE form when it comes in. Counseled on routine screenings. Flu shot declined. Labwork as below.  - Comprehensive Metabolic Panel (CMET) - CBC with Differential - Lipid Profile - TSH  2. Acute pain of right knee  Reviewed OV from [facility] 01/10/2017 which showed arthritis changes. May need to change to low impact exercises. Counseled he should call [facility] as he is existing patient there and get scheduled. Can place referral if needed.   3. Left hip pain  The entirety of the information documented in the History of Present Illness, Review of Systems and Physical Exam were personally obtained by me. Portions of this information were initially documented by [facility], CMA and reviewed by me for thoroughness and accuracy.   F/u 1 year  CPE    --------------------------------------------------------------------

## 2019-07-17 ENCOUNTER — Telehealth: Payer: Self-pay

## 2019-07-17 LAB — CBC WITH DIFFERENTIAL/PLATELET
Basophils Absolute: 0 10*3/uL (ref 0.0–0.2)
Basos: 0 %
EOS (ABSOLUTE): 0.1 10*3/uL (ref 0.0–0.4)
Eos: 1 %
Hematocrit: 43.1 % (ref 37.5–51.0)
Hemoglobin: 14.1 g/dL (ref 13.0–17.7)
Immature Grans (Abs): 0 10*3/uL (ref 0.0–0.1)
Immature Granulocytes: 0 %
Lymphocytes Absolute: 3.3 10*3/uL — ABNORMAL HIGH (ref 0.7–3.1)
Lymphs: 32 %
MCH: 29.4 pg (ref 26.6–33.0)
MCHC: 32.7 g/dL (ref 31.5–35.7)
MCV: 90 fL (ref 79–97)
Monocytes Absolute: 0.5 10*3/uL (ref 0.1–0.9)
Monocytes: 5 %
Neutrophils Absolute: 6.5 10*3/uL (ref 1.4–7.0)
Neutrophils: 62 %
Platelets: 225 10*3/uL (ref 150–450)
RBC: 4.79 x10E6/uL (ref 4.14–5.80)
RDW: 11.8 % (ref 11.6–15.4)
WBC: 10.5 10*3/uL (ref 3.4–10.8)

## 2019-07-17 LAB — COMPREHENSIVE METABOLIC PANEL
ALT: 22 IU/L (ref 0–44)
AST: 29 IU/L (ref 0–40)
Albumin/Globulin Ratio: 1.5 (ref 1.2–2.2)
Albumin: 4.5 g/dL (ref 4.0–5.0)
Alkaline Phosphatase: 66 IU/L (ref 39–117)
BUN/Creatinine Ratio: 13 (ref 9–20)
BUN: 15 mg/dL (ref 6–24)
Bilirubin Total: 1.5 mg/dL — ABNORMAL HIGH (ref 0.0–1.2)
CO2: 22 mmol/L (ref 20–29)
Calcium: 9.4 mg/dL (ref 8.7–10.2)
Chloride: 100 mmol/L (ref 96–106)
Creatinine, Ser: 1.13 mg/dL (ref 0.76–1.27)
GFR calc Af Amer: 92 mL/min/{1.73_m2} (ref >59)
GFR calc non Af Amer: 80 mL/min/{1.73_m2} (ref >59)
Globulin, Total: 3 g/dL (ref 1.5–4.5)
Glucose: 97 mg/dL (ref 65–99)
Potassium: 4.1 mmol/L (ref 3.5–5.2)
Sodium: 139 mmol/L (ref 134–144)
Total Protein: 7.5 g/dL (ref 6.0–8.5)

## 2019-07-17 LAB — TSH: TSH: 1.71 u[IU]/mL (ref 0.450–4.500)

## 2019-07-17 LAB — LIPID PANEL
Chol/HDL Ratio: 2.2 ratio (ref 0.0–5.0)
Cholesterol, Total: 134 mg/dL (ref 100–199)
HDL: 62 mg/dL (ref >39)
LDL Chol Calc (NIH): 62 mg/dL (ref 0–99)
Triglycerides: 42 mg/dL (ref 0–149)
VLDL Cholesterol Cal: 10 mg/dL (ref 5–40)

## 2019-07-17 NOTE — Telephone Encounter (Signed)
Pt advised.   Thanks,   -Rollin Kotowski  

## 2019-07-17 NOTE — Telephone Encounter (Signed)
-----   Message from Trinna Post, [place] sent at 07/17/2019  8:18 AM EST ----- Labs are normal and stable.

## 2019-07-20 ENCOUNTER — Telehealth: Payer: Self-pay

## 2019-07-20 NOTE — Telephone Encounter (Signed)
Patient was advised and states that he will be in on Monday, 07/23/2019 to sign paperwork so it could be faxed.  Copied from [place] [id number]. Topic: General - Inquiry >> Jul 18, 2019  3:16 PM Virl Axe D wrote: Reason for CRM: Pt's wife stated she faxed over a screening form/metric for on 07/16/19 for pt. She would like a callback to know if it was received or not received. They have to turn in by 07/24/19. Please advise. [phone]. Wife is on [place].

## 2020-07-23 ENCOUNTER — Encounter: Payer: Commercial Managed Care - PPO | Admitting: Physician Assistant

## 2020-08-04 ENCOUNTER — Other Ambulatory Visit: Payer: Self-pay

## 2020-08-04 ENCOUNTER — Encounter: Payer: Self-pay | Admitting: Physician Assistant

## 2020-08-04 ENCOUNTER — Ambulatory Visit (INDEPENDENT_AMBULATORY_CARE_PROVIDER_SITE_OTHER): Payer: Commercial Managed Care - PPO | Admitting: Physician Assistant

## 2020-08-04 VITALS — BP 112/86 | HR 56 | Temp 98.3°F | Ht 72.0 in | Wt 231.1 lb

## 2020-08-04 DIAGNOSIS — Z0000 Encounter for general adult medical examination without abnormal findings: Secondary | ICD-10-CM | POA: Diagnosis not present

## 2020-08-04 DIAGNOSIS — Z1159 Encounter for screening for other viral diseases: Secondary | ICD-10-CM | POA: Diagnosis not present

## 2020-08-04 DIAGNOSIS — E66811 Obesity, class 1: Secondary | ICD-10-CM

## 2020-08-04 DIAGNOSIS — E669 Obesity, unspecified: Secondary | ICD-10-CM

## 2020-08-04 NOTE — Progress Notes (Signed)
Complete physical exam   Patient: Dean Henderson   DOB: 02/11/1976   44 y.o. Male  MRN: [id number] Visit Date: 08/04/2020  Today's healthcare provider: Trinna Post, PA-C   Chief Complaint  Patient presents with  . Annual Exam  I,Porsha C McClurkin,acting as a scribe for Trinna Post, PA-C.,have documented all relevant documentation on the behalf of Trinna Post, PA-C,as directed by  Trinna Post, PA-C while in the presence of Trinna Post, PA-C.  Subjective    Dean Henderson is a 44 y.o. male who presents today for a complete physical exam.  He reports consuming a general and healthy diet. Gym/ health club routine includes cardio, mod to heavy weightlifting and running. He generally feels well. He reports sleeping well. He does not have additional problems to discuss today.  HPI   Wt Readings from Last 3 Encounters:  08/04/20 231 lb 1.6 oz (104.8 kg)  07/16/19 228 lb (103.4 kg)  07/10/18 228 lb (103.4 kg)     No past medical history on file. Past Surgical History:  Procedure Laterality Date  . NO PAST SURGERIES     Social History   Socioeconomic History  . Marital status: Married    Spouse name: Not on file  . Number of children: Not on file  . Years of education: Not on file  . Highest education level: Not on file  Occupational History  . Not on file  Tobacco Use  . Smoking status: Never Smoker  . Smokeless tobacco: Never Used  Vaping Use  . Vaping Use: Never used  Substance and Sexual Activity  . Alcohol use: No  . Drug use: No  . Sexual activity: Not on file  Other Topics Concern  . Not on file  Social History Narrative  . Not on file   Social Determinants of Health   Financial Resource Strain: Not on file  Food Insecurity: Not on file  Transportation Needs: Not on file  Physical Activity: Not on file  Stress: Not on file  Social Connections: Not on file  Intimate Partner Violence: Not on file   Family Status  Relation  Name Status  . Mother  Alive  . Father  Alive  . PGM  (Not Specified)  . Annamarie Major  (Not Specified)   Family History  Problem Relation Age of Onset  . Healthy Mother   . Diabetes Father   . Hypertension Father   . Throat cancer Paternal Grandmother   . Cancer Paternal Grandmother   . Cancer Paternal Uncle    Allergies  Allergen Reactions  . Other Hives    Tea  . Pork-Derived Products   . Tea     Patient Care Team: Paulene Floor as PCP - General (Physician Assistant)   Medications: Outpatient Medications Prior to Visit  Medication Sig  . clobetasol cream (TEMOVATE) 0.05 % APPLY TO AFFECTED AREA TWICE A DAY   No facility-administered medications prior to visit.    Review of Systems  Constitutional: Negative.   HENT: Negative.   Eyes: Negative.   Respiratory: Negative.   Cardiovascular: Negative.   Gastrointestinal: Negative.   Endocrine: Negative.   Genitourinary: Negative.   Musculoskeletal: Negative.   Skin: Negative.   Allergic/Immunologic: Negative.   Neurological: Negative.   Hematological: Negative.   Psychiatric/Behavioral: Negative.       Objective    BP 112/86 (BP Location: Left Arm, Patient Position: Sitting, Cuff Size: Large)  Pulse (!) 56   Temp 98.3 F (36.8 C) (Oral)   Ht 6' (1.829 m)   Wt 231 lb 1.6 oz (104.8 kg)   SpO2 100%   BMI 31.34 kg/m    Physical Exam Constitutional:      Appearance: Normal appearance.  HENT:     Right Ear: Tympanic membrane, ear canal and external ear normal.     Left Ear: Tympanic membrane, ear canal and external ear normal.  Cardiovascular:     Rate and Rhythm: Normal rate and regular rhythm.     Pulses: Normal pulses.     Heart sounds: Normal heart sounds.  Pulmonary:     Effort: Pulmonary effort is normal.     Breath sounds: Normal breath sounds.  Abdominal:     General: Abdomen is flat. Bowel sounds are normal.     Palpations: Abdomen is soft.  Skin:    General: Skin is warm and dry.   Neurological:     General: No focal deficit present.     Mental Status: He is alert and oriented to person, place, and time.  Psychiatric:        Mood and Affect: Mood normal.        Behavior: Behavior normal.       Last depression screening scores PHQ 2/9 Scores 08/04/2020 07/16/2019 07/10/2018  PHQ - 2 Score 0 0 0  PHQ- 9 Score 0 2 0   Last fall risk screening Fall Risk  08/04/2020  Falls in the past year? 0  Number falls in past yr: 0  Injury with Fall? 0  Risk for fall due to : No Fall Risks  Follow up Falls evaluation completed   Last Audit-C alcohol use screening Alcohol Use Disorder Test (AUDIT) 08/04/2020  1. How often do you have a drink containing alcohol? 1  2. How many drinks containing alcohol do you have on a typical day when you are drinking? 0  3. How often do you have six or more drinks on one occasion? 0  AUDIT-C Score 1   A score of 3 or more in women, and 4 or more in men indicates increased risk for alcohol abuse, EXCEPT if all of the points are from question 1   No results found for any visits on 08/04/20.  Assessment & Plan    Routine Health Maintenance and Physical Exam  Exercise Activities and Dietary recommendations Goals   None     Immunization History  Administered Date(s) Administered  . Influenza, Seasonal, Injecte, Preservative Fre 06/24/2008, 06/23/2009  . Td 07/10/2018  . Tdap 06/24/2008    Health Maintenance  Topic Date Due  . Hepatitis C Screening  Never done  . COVID-19 Vaccine (1) Never done  . INFLUENZA VACCINE  03/23/2020  . TETANUS/TDAP  07/10/2028  . HIV Screening  Completed    Discussed health benefits of physical activity, and encouraged him to engage in regular exercise appropriate for his age and condition.  1. Annual physical exam  - TSH - Lipid panel - Comprehensive metabolic panel - CBC with Differential/Platelet  2. Need for hepatitis C screening test  - Hepatitis C antibody  3. Obesity, Class  I, BMI 30-34.9  Discussed importance of healthy weight management Discussed diet and exercise    No follow-ups on file.     ITrinna Post, PA-C, have reviewed all documentation for this visit. The documentation on 08/04/20 for the exam, diagnosis, procedures, and orders are all accurate and complete.  The entirety of the information documented in the History of Present Illness, Review of Systems and Physical Exam were personally obtained by me. Portions of this information were initially documented by [facility] and reviewed by me for thoroughness and accuracy.     Paulene Floor  [facility] [phone] (phone) [phone] (fax)  [place]

## 2020-08-05 ENCOUNTER — Telehealth: Payer: Self-pay

## 2020-08-05 DIAGNOSIS — D72820 Lymphocytosis (symptomatic): Secondary | ICD-10-CM

## 2020-08-05 LAB — CBC WITH DIFFERENTIAL/PLATELET
Basophils Absolute: 0 10*3/uL (ref 0.0–0.2)
Basos: 0 %
EOS (ABSOLUTE): 0.1 10*3/uL (ref 0.0–0.4)
Eos: 1 %
Hematocrit: 43.3 % (ref 37.5–51.0)
Hemoglobin: 14.4 g/dL (ref 13.0–17.7)
Immature Grans (Abs): 0 10*3/uL (ref 0.0–0.1)
Immature Granulocytes: 0 %
Lymphocytes Absolute: 3.5 10*3/uL — ABNORMAL HIGH (ref 0.7–3.1)
Lymphs: 37 %
MCH: 29.4 pg (ref 26.6–33.0)
MCHC: 33.3 g/dL (ref 31.5–35.7)
MCV: 89 fL (ref 79–97)
Monocytes Absolute: 0.4 10*3/uL (ref 0.1–0.9)
Monocytes: 4 %
Neutrophils Absolute: 5.4 10*3/uL (ref 1.4–7.0)
Neutrophils: 58 %
Platelets: 229 10*3/uL (ref 150–450)
RBC: 4.89 x10E6/uL (ref 4.14–5.80)
RDW: 11.6 % (ref 11.6–15.4)
WBC: 9.4 10*3/uL (ref 3.4–10.8)

## 2020-08-05 LAB — COMPREHENSIVE METABOLIC PANEL
ALT: 28 IU/L (ref 0–44)
AST: 25 IU/L (ref 0–40)
Albumin/Globulin Ratio: 1.4 (ref 1.2–2.2)
Albumin: 4.3 g/dL (ref 4.0–5.0)
Alkaline Phosphatase: 72 IU/L (ref 44–121)
BUN/Creatinine Ratio: 10 (ref 9–20)
BUN: 13 mg/dL (ref 6–24)
Bilirubin Total: 1.4 mg/dL — ABNORMAL HIGH (ref 0.0–1.2)
CO2: 22 mmol/L (ref 20–29)
Calcium: 9.3 mg/dL (ref 8.7–10.2)
Chloride: 101 mmol/L (ref 96–106)
Creatinine, Ser: 1.24 mg/dL (ref 0.76–1.27)
GFR calc Af Amer: 82 mL/min/{1.73_m2} (ref >59)
GFR calc non Af Amer: 71 mL/min/{1.73_m2} (ref >59)
Globulin, Total: 3 g/dL (ref 1.5–4.5)
Glucose: 89 mg/dL (ref 65–99)
Potassium: 4.4 mmol/L (ref 3.5–5.2)
Sodium: 140 mmol/L (ref 134–144)
Total Protein: 7.3 g/dL (ref 6.0–8.5)

## 2020-08-05 LAB — TSH: TSH: 0.972 u[IU]/mL (ref 0.450–4.500)

## 2020-08-05 LAB — LIPID PANEL
Chol/HDL Ratio: 2.4 ratio (ref 0.0–5.0)
Cholesterol, Total: 141 mg/dL (ref 100–199)
HDL: 59 mg/dL (ref >39)
LDL Chol Calc (NIH): 69 mg/dL (ref 0–99)
Triglycerides: 61 mg/dL (ref 0–149)
VLDL Cholesterol Cal: 13 mg/dL (ref 5–40)

## 2020-08-05 LAB — HEPATITIS C ANTIBODY: Hep C Virus Ab: 0.1 s/co ratio (ref 0.0–0.9)

## 2020-08-05 NOTE — Telephone Encounter (Signed)
-----   Message from Trinna Post, [place] sent at 08/05/2020  8:20 AM EST ----- Patient doesn't have MyChart, will need a call.  Hi Dean Henderson,   Your labs are normal except for a slight elevation in your lymphocytes, or a type of white blood cell. A few years ago, you also had an elevated total white count which typically means an infection. However, over your past two checks, the lymphocytes have remained elevated. Our lab values have changed in the interim, meaning there is now a lower threshold to consider this value increased. So all in all it is a slight elevation but if people have persistent changes, I usually recommend they see a hematologist at least once. If you are OK with it, I am happy to place the referral for you.   Best, Carles Collet, PA-C

## 2020-08-05 NOTE — Telephone Encounter (Signed)
Spoke with patient personally. Hematology referral placed.

## 2020-08-05 NOTE — Telephone Encounter (Signed)
Patient was advised and states that he would like to have a call from you if possible. He is concerned that it maybe something bad. I advised patient in case you can not call him to active his MyChart and send a message that way. Please advise.

## 2020-09-18 ENCOUNTER — Telehealth: Payer: Self-pay | Admitting: Physician Assistant

## 2020-09-18 NOTE — Telephone Encounter (Signed)
Patient's wife called to follow up on biometric forms that should have been faxed to patient's employer.  She stated that the patient came to the office this week to sign papers to have these forms faxed.  Would like to know the status.  Please call to discuss at [phone]

## 2020-09-19 NOTE — Telephone Encounter (Signed)
I wrote a note explaining this. They can pick it up in person or make a mychart and I can resend it.

## 2020-09-19 NOTE — Telephone Encounter (Signed)
Patient's wife is calling wanting to know why it took so long to get his biometric screening forms faxed.  She states that it had to be in by the end of December and now it is going to cost her over $600 a year because it was not completed and sent in by the end of the year.  I explained to her that you were out sick and was not able to sign it.  She is asking for some kind of documentation to submit to her HR department so maybe they can appeal this.

## 2020-09-19 NOTE — Telephone Encounter (Signed)
I have let patient's wife know and she states that she will come by today to pick up letter.

## 2021-08-04 ENCOUNTER — Encounter: Payer: Self-pay | Admitting: Physician Assistant

## 2022-12-12 ENCOUNTER — Ambulatory Visit
Admission: EM | Admit: 2022-12-12 | Discharge: 2022-12-12 | Disposition: A | Discharge status: Home / Self Care | Payer: Commercial Managed Care - PPO | Attending: Emergency Medicine | Admitting: Emergency Medicine

## 2022-12-12 DIAGNOSIS — R21 Rash and other nonspecific skin eruption: Secondary | ICD-10-CM

## 2022-12-12 MED ORDER — TRIAMCINOLONE ACETONIDE 0.1 % EX CREA: 1.0000 | TOPICAL_CREAM | Freq: Two times a day (BID) | CUTANEOUS | 0 refills | Status: AC

## 2022-12-12 NOTE — ED Provider Notes (Signed)
Renaldo Fiddler    CSN: [id number] Arrival date & time: 12/12/22  4098      History   Chief Complaint Chief Complaint  Patient presents with   Allergic Reaction    HPI Dean Henderson is a 47 y.o. male.  Patient presents with rash on his lower legs x 1 month.  The rash started as a lesion on his right lower leg and has spread to both lower legs.  The lesions dry up and heal but new lesions appear.  They are pruritic.  No redness or drainage from the lesions.  He has been treating the areas with a topical steroid cream that was previously prescribed for a rash but the cream has expired.  He denies fever, chills, or other symptoms.  The history is provided by the patient, the spouse and medical records.    History reviewed. No pertinent past medical history.  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 Authorizing Provider  triamcinolone cream (KENALOG) 0.1 % Apply 1 Application topically 2 (two) times daily. 12/12/22  Yes Mickie Bail, NP    Family History Family History  Problem Relation Age of Onset   Healthy Mother    Diabetes Father    Hypertension Father    Throat cancer Paternal Grandmother    Cancer Paternal Grandmother    Cancer Paternal Uncle     Social History Social History   Tobacco Use   Smoking status: Never   Smokeless tobacco: Never  Vaping Use   Vaping Use: Never used  Substance Use Topics   Alcohol use: No   Drug use: No     Allergies   Other, Pork-derived products, and Tea   Review of  Systems Review of Systems  Constitutional:  Negative for chills and fever.  Musculoskeletal:  Negative for arthralgias, gait problem and joint swelling.  Skin:  Positive for rash. Negative for color change.  Neurological:  Negative for weakness and numbness.  All other systems reviewed and are negative.    Physical Exam Triage Vital Signs ED Triage Vitals  Enc Vitals Group     BP 12/12/22 0830 118/68     Pulse Rate 12/12/22 0823 60     Resp 12/12/22 0823 18     Temp 12/12/22 0823 97.8 F (36.6 C)     Temp src --      SpO2 12/12/22 0823 98 %     Weight --      Height --      Head Circumference --      Peak Flow --      Pain Score 12/12/22 0824 0     Pain Loc --      Pain Edu? --      Excl. in GC? --    No data found.  Updated Vital Signs BP 118/68   Pulse 60   Temp 97.8 F (36.6 C)   Resp 18   SpO2 98%   Visual Acuity Right Eye  Distance:   Left Eye Distance:   Bilateral Distance:    Right Eye Near:   Left Eye Near:    Bilateral Near:     Physical Exam Constitutional:      General: He is not in acute distress.    Appearance: Normal appearance. He is not ill-appearing.  HENT:     Mouth/Throat:     Mouth: Mucous membranes are moist.  Cardiovascular:     Rate and Rhythm: Normal rate and regular rhythm.     Heart sounds: Normal heart sounds.  Pulmonary:     Effort: Pulmonary effort is normal. No respiratory distress.     Breath sounds: Normal breath sounds.  Musculoskeletal:        General: No swelling, tenderness or deformity. Normal range of motion.  Skin:    General: Skin is warm and dry.     Findings: Rash present. No bruising or erythema.     Comments: Scattered lesions on both lower legs in various stages of healing.  No drainage or erythema.  Neurological:     General: No focal deficit present.     Mental Status: He is alert and oriented to person, place, and time.     Sensory: No sensory deficit.     Motor: No weakness.     Gait: Gait normal.   Psychiatric:        Mood and Affect: Mood normal.        Behavior: Behavior normal.      UC Treatments / Results  Labs (all labs ordered are listed, but only abnormal results are displayed) Labs Reviewed - No data to display  EKG   Radiology No results found.  Procedures Procedures (including critical care time)  Medications Ordered in UC Medications - No data to display  Initial Impression / Assessment and Plan / UC Course  I have reviewed the triage vital signs and the nursing notes.  Pertinent labs & imaging results that were available during my care of the patient were reviewed by me and considered in my medical decision making (see chart for details).    Rash on bilateral lower legs.  These appear to be insect bites.  Discussed Benadryl or Zyrtec for itching.  Treating with triamcinolone cream.  Instructed patient to use this only on lesions that are not scratched open.  Discussed OTC antibiotic ointment for any lesions that he scratches open.  Instructed him to follow-up with his PCP or dermatologist if he is not improving.  He agrees to plan of care.  Final Clinical Impressions(s) / UC Diagnoses   Final diagnoses:  Rash     Discharge Instructions      Use the triamcinolone cream as directed.  Follow up with your primary care provider or a dermatologist if your symptoms are not improving.        ED Prescriptions     Medication Sig Dispense Auth. Provider   triamcinolone cream (KENALOG) 0.1 % Apply 1 Application topically 2 (two) times daily. 30 g Mickie Bail, NP      PDMP not reviewed this encounter.   Mickie Bail, NP 12/12/22 [phone]

## 2022-12-12 NOTE — ED Triage Notes (Signed)
Patient to Urgent Care with complaints of allergic reaction/ rash present to bilateral lower legs. Unsure of what could have caused him to break out. Symptoms started a few month ago. Has been applying creams. At times itchy- mostly when standing fo extended periods of time.

## 2022-12-12 NOTE — Discharge Instructions (Addendum)
Use the triamcinolone cream as directed.  Follow-up with your primary care provider or a dermatologist if your symptoms are not improving.     

## 2023-04-04 ENCOUNTER — Encounter: Payer: Self-pay | Admitting: Family Medicine

## 2023-04-04 ENCOUNTER — Ambulatory Visit (INDEPENDENT_AMBULATORY_CARE_PROVIDER_SITE_OTHER): Payer: BC Managed Care – PPO | Admitting: Family Medicine

## 2023-04-04 VITALS — BP 115/87 | HR 59 | Ht 72.0 in | Wt 220.0 lb

## 2023-04-04 DIAGNOSIS — Z130 Encounter for screening for diseases of the blood and blood-forming organs and certain disorders involving the immune mechanism: Secondary | ICD-10-CM | POA: Diagnosis not present

## 2023-04-04 DIAGNOSIS — Z0000 Encounter for general adult medical examination without abnormal findings: Secondary | ICD-10-CM

## 2023-04-04 DIAGNOSIS — Z131 Encounter for screening for diabetes mellitus: Secondary | ICD-10-CM | POA: Diagnosis not present

## 2023-04-04 DIAGNOSIS — Z1211 Encounter for screening for malignant neoplasm of colon: Secondary | ICD-10-CM | POA: Diagnosis not present

## 2023-04-04 DIAGNOSIS — Z13220 Encounter for screening for lipoid disorders: Secondary | ICD-10-CM

## 2023-04-04 DIAGNOSIS — L210 Seborrhea capitis: Secondary | ICD-10-CM | POA: Insufficient documentation

## 2023-04-04 DIAGNOSIS — E663 Overweight: Secondary | ICD-10-CM

## 2023-04-04 MED ORDER — ACYCLOVIR 400 MG PO TABS: 400.0000 mg | ORAL_TABLET | Freq: Three times a day (TID) | ORAL | 0 refills | Status: AC

## 2023-04-04 NOTE — Progress Notes (Signed)
Complete physical exam   Patient: Dean Henderson   DOB: [birth year]   47 y.o. Male  MRN: [id number] Visit Date: 04/04/2023  Today's healthcare provider: Ronnald Ramp, MD   Chief Complaint  Patient presents with   Annual Exam    Patient would like lab work, overall health, also would like an antibiotic for rash on bilateral lower legs, itchy at times, prior treatment triamcinolone cream, Ttree oil, castor oil- ineffective    Subjective    Dean Henderson is a 47 y.o. male who presents today for a complete physical exam.   He reports consuming a general diet.    Runs frequently and works out  He generally feels well.   He reports sleeping well.    He does have additional problems to discuss today.   Discussed the use of AI scribe software for clinical note transcription with the patient, who gave verbal consent to proceed.  History of Present Illness   The patient, a Paediatric nurse by profession, presents with a persistent skin condition on their legs that has been present for approximately six to eight months. The lesions, which have not fully resolved despite various treatments, are described as scab-like and are occasionally itchy. The patient reports that the itching is not constant and does not occur daily. The lesions are localized to the legs and have not spread to other areas of the body such as the back or chest.  The patient sought care from a dermatologist approximately two to three months ago, who prescribed a topical steroid (triamcinolone). Despite consistent use, the patient reports no significant improvement in the lesions. They have also tried various home remedies including tea tree oil and castor oil, with no success. The patient has not returned to the dermatologist since the initial visit.  In the past, the patient had a similar skin condition on their foot, which was successfully treated with an oral medication. The patient could not recall the name of  the medication but expressed a desire for a similar treatment for the current skin condition.  The patient is active, frequently running and working out, and has not noticed any correlation between their physical activity and the skin condition. 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  The patient has not noticed any changes in their overall health or other symptoms associated with the skin condition. They have not tried any antifungal treatments or allergy medications for the skin condition. The patient has not noticed any changes in their diet or lifestyle that could potentially be contributing to the skin condition.  The patient has not noticed any changes in their overall health or other symptoms associated with the skin condition. They have not tried any antifungal treatments or allergy medications for the skin condition. The patient has not noticed any changes in their diet or lifestyle that could potentially be contributing to the skin condition.       History reviewed. No pertinent past medical history. Past Surgical History:  Procedure Laterality Date   INCISION AND DRAINAGE     forehead   NO PAST SURGERIES     Social History   Socioeconomic History   Marital status: Married    Spouse name: Not on file   Number of children: Not on file   Years of education: Not on file   Highest education level: Not on file  Occupational  History   Not on file  Tobacco Use   Smoking status: Never   Smokeless tobacco: Never  Vaping Use   Vaping status: Never Used  Substance and Sexual Activity   Alcohol use: No   Drug use: No   Sexual activity: Not on file  Other Topics Concern   Not on file  Social History Narrative   Not on file   Social Determinants of Health   Financial Resource Strain: Not on file  Food Insecurity: Not on file  Transportation  Needs: Not on file  Physical Activity: Not on file  Stress: Not on file  Social Connections: Not on file  Intimate Partner Violence: Not on file   Family Status  Relation Name Status   Mother  Alive   Father  Alive   PGM  (Not Specified)   Oneal Grout  (Not Specified)  No partnership data on file   Family History  Problem Relation Age of Onset   Healthy Mother    Diabetes Father    Hypertension Father    Throat cancer Paternal Grandmother    Cancer Paternal Grandmother    Cancer Paternal Uncle    Allergies  Allergen Reactions   Other Hives    Tea   Pork-Derived Products    Tea      Medications: Outpatient Medications Prior to Visit  Medication Sig   triamcinolone cream (KENALOG) 0.1 % Apply 1 Application topically 2 (two) times daily.   No facility-administered medications prior to visit.    Review of Systems     Objective    BP 115/87 (BP Location: Left Arm, Patient Position: Sitting, Cuff Size: Large)   Pulse (!) 59   Ht 6' (1.829 m)   Wt 220 lb (99.8 kg)   SpO2 100%   BMI 29.84 kg/m      Physical Exam Vitals reviewed.  Constitutional:      General: He is not in acute distress.    Appearance: Normal appearance. He is not ill-appearing, toxic-appearing or diaphoretic.  HENT:     Head: Normocephalic and atraumatic.     Right Ear: Tympanic membrane and external ear normal.     Left Ear: Tympanic membrane and external ear normal.     Nose: Nose normal.     Mouth/Throat:     Mouth: Mucous membranes are moist.     Pharynx: No oropharyngeal exudate or posterior oropharyngeal erythema.  Eyes:     General: No scleral icterus.    Extraocular Movements: Extraocular movements intact.     Conjunctiva/sclera: Conjunctivae normal.     Pupils: Pupils are equal, round, and reactive to light.  Neck:     Vascular: No carotid bruit.  Cardiovascular:     Rate and Rhythm: Normal rate and regular rhythm.     Pulses: Normal pulses.     Heart sounds: Normal heart  sounds. No murmur heard.    No friction rub. No gallop.  Pulmonary:     Effort: Pulmonary effort is normal. No respiratory distress.     Breath sounds: Normal breath sounds. No stridor. No wheezing, rhonchi or rales.  Abdominal:     General: Bowel sounds are normal. There is no distension.     Palpations: Abdomen is soft.     Tenderness: There is no abdominal tenderness.  Musculoskeletal:        General: No swelling, tenderness or signs of injury. Normal range of motion.     Cervical back: Normal range of motion and  neck supple. No rigidity or tenderness.     Right lower leg: No edema.     Left lower leg: No edema.  Lymphadenopathy:     Cervical: No cervical adenopathy.  Skin:    General: Skin is warm and dry.     Capillary Refill: Capillary refill takes less than 2 seconds.     Findings: Rash present. No erythema.  Neurological:     General: No focal deficit present.     Mental Status: He is alert and oriented to person, place, and time.     Cranial Nerves: Cranial nerves 2-12 are intact.     Sensory: Sensation is intact.     Motor: Motor function is intact. No weakness, tremor or abnormal muscle tone.     Gait: Gait normal.  Psychiatric:        Attention and Perception: Attention normal.        Mood and Affect: Mood normal.        Speech: Speech normal.        Behavior: Behavior normal. Behavior is cooperative.        Thought Content: Thought content normal.          Last depression screening scores    08/04/2020    1:57 PM 07/16/2019    2:22 PM 07/10/2018    2:02 PM  PHQ 2/9 Scores  PHQ - 2 Score 0 0 0  PHQ- 9 Score 0 2 0    Last fall risk screening    08/04/2020    1:57 PM  Fall Risk   Falls in the past year? 0  Number falls in past yr: 0  Injury with Fall? 0  Risk for fall due to : No Fall Risks  Follow up Falls evaluation completed    Last Audit-C alcohol use screening    08/04/2020    1:57 PM  Alcohol Use Disorder Test (AUDIT)  1. How often  do you have a drink containing alcohol? 1  2. How many drinks containing alcohol do you have on a typical day when you are drinking? 0  3. How often do you have six or more drinks on one occasion? 0  AUDIT-C Score 1   A score of 3 or more in women, and 4 or more in men indicates increased risk for alcohol abuse, EXCEPT if all of the points are from question 1   No results found for any visits on 04/04/23.  Assessment & Plan    Routine Health Maintenance and Physical Exam  Immunization History  Administered Date(s) Administered   Influenza, Seasonal, Injecte, Preservative Fre 06/24/2008, 06/23/2009   Td 07/10/2018   Tdap 06/24/2008    Health Maintenance  Topic Date Due   Colonoscopy  Never done   COVID-19 Vaccine (1 - 2023-24 season) Never done   INFLUENZA VACCINE  03/24/2023   DTaP/Tdap/Td (3 - Td or Tdap) 07/10/2028   Hepatitis C Screening  Completed   HIV Screening  Completed   HPV VACCINES  Aged Out    Problem List Items Addressed This Visit     Annual physical exam - Primary    Chronic conditions are stable  Patient was counseled on benefits of regular physical activity with goal of 150 minutes of moderate to vigurous intensity 4 days per week  Patient was counseled to consume well balanced diet of fruits, vegetables, limited saturated fats and limited sugary foods and beverages with emphasis on consuming 6-8 glasses of water daily  Screening recommended  today: A1c, lipids, CMP, CBC, TSH  Vaccines recommended today: COVID- declined  Also recommended colonoscopy (patient prefers to think on this for a few more years, does not want to have cologuard of FOBT)        Overweight (BMI 25.0-29.9)   Relevant Orders   CMP14+EGFR   TSH+T4F+T3Free   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       Ronnald Ramp, MD  [facility] [phone] (phone) [phone] (fax)  [facility]

## 2023-04-04 NOTE — Addendum Note (Signed)
Addended by: Bing Neighbors on: 04/04/2023 05:14 PM   Modules accepted: Level of Service

## 2023-04-04 NOTE — Patient Instructions (Signed)
 We will follow up with results of labs once they are available.

## 2023-04-04 NOTE — Assessment & Plan Note (Signed)
Chronic conditions are stable  Patient was counseled on benefits of regular physical activity with goal of 150 minutes of moderate to vigurous intensity 4 days per week  Patient was counseled to consume well balanced diet of fruits, vegetables, limited saturated fats and limited sugary foods and beverages with emphasis on consuming 6-8 glasses of water daily  Screening recommended today: A1c, lipids, CMP, CBC, TSH  Vaccines recommended today: COVID- declined  Also recommended colonoscopy (patient prefers to think on this for a few more years, does not want to have cologuard of FOBT)

## 2023-04-05 ENCOUNTER — Other Ambulatory Visit: Payer: Self-pay

## 2023-04-05 DIAGNOSIS — R17 Unspecified jaundice: Secondary | ICD-10-CM

## 2023-07-25 DIAGNOSIS — G5752 Tarsal tunnel syndrome, left lower limb: Secondary | ICD-10-CM | POA: Diagnosis not present

## 2023-07-25 DIAGNOSIS — M76822 Posterior tibial tendinitis, left leg: Secondary | ICD-10-CM | POA: Diagnosis not present

## 2023-07-25 DIAGNOSIS — M216X1 Other acquired deformities of right foot: Secondary | ICD-10-CM | POA: Diagnosis not present

## 2023-07-25 DIAGNOSIS — M79672 Pain in left foot: Secondary | ICD-10-CM | POA: Diagnosis not present

## 2023-12-26 ENCOUNTER — Ambulatory Visit: Payer: Self-pay

## 2023-12-26 NOTE — Telephone Encounter (Signed)
 Chief Complaint: Eye pain on right pain Symptoms: Pain in right eye Frequency: two weeks  Pertinent Negatives: Patient denies fever Disposition: [] ED /[x] Urgent Care (no appt availability in office) / [] Appointment(In office/virtual)/ []  [facility]/ [] Home Care/ [] Refused Recommended Disposition /[] [facility]/ []  Follow-up with PCP Additional Notes: Patient called in stating he is experiencing pain in his right eye for roughly two weeks, that he noticed with onset after the flu. Patient states the pain is above his eyeball and internal, and is worsened when looking to the right and when bending over. Patient also feels head pressure when bending over. Patient also states he has irritation and pain in his right ear. Patient advised to be evaluated within 24 hours. There is no appt availability in office. Patient states he will be going to [facility] at [facility] tomorrow morning at 8 am as a walk in.    Copied from CRM [id number]. Topic: Clinical - Red Word Triage >> Dec 26, 2023  4:07 PM Ivette P wrote: Red Word that prompted transfer to Nurse Triage: eye doctor. having headaches. and started after got sick with flu  right eye, when trying to look and turns eye hurts real bad. not every day its on constant occasion.    - when bending over. pressure starts to squeeze and standing back up goes away. alot of pressure   - when looking to right hurts. using whole head to turn and see. Reason for Disposition  Eye pain present > 24 hours  Answer Assessment - Initial Assessment Questions 1. ONSET: "When did the pain start?" (e.g., minutes, hours, days)     2 weeks 2. TIMING: "Does the pain come and go, or has it been constant since it started?" (e.g., constant, intermittent, fleeting)     Comes and goes 3. SEVERITY: "How bad is the pain?"   (Scale 1-10; mild, moderate or severe)   - MILD (1-3): doesn't interfere with normal activities    - MODERATE (4-7): interferes with  normal activities or awakens from sleep    - SEVERE (8-10): excruciating pain and patient unable to do normal activities     6 4. LOCATION: "Where does it hurt?"  (e.g., eyelid, eye, cheekbone)     Right eyeball, inside on top 5. CAUSE: "What do you think is causing the pain?"     Patient uncertain 6. VISION: "Do you have blurred vision or changes in your vision?"      No 7. EYE DISCHARGE: "Is there any discharge (pus) from the eye(s)?"  If Yes, ask: "What color is it?"      No 8. FEVER: "Do you have a fever?" If Yes, ask: "What is it, how was it measured, and when did it start?"      No 9. OTHER SYMPTOMS: "Do you have any other symptoms?" (e.g., headache, nasal discharge, facial rash)     Headache/pressure when bending over  Protocols used: Eye Pain and Other Symptoms-A-AH

## 2024-06-18 ENCOUNTER — Encounter: Payer: Self-pay | Admitting: Family Medicine

## 2024-06-19 ENCOUNTER — Encounter: Payer: Self-pay | Admitting: Family Medicine

## 2024-09-10 ENCOUNTER — Encounter: Payer: Self-pay | Admitting: Family Medicine

## 2024-09-17 ENCOUNTER — Encounter: Payer: Self-pay | Admitting: Family Medicine

## 2024-09-25 ENCOUNTER — Encounter: Payer: Self-pay | Admitting: Family Medicine

## 2024-10-08 ENCOUNTER — Encounter: Payer: Self-pay | Admitting: Family Medicine

## 2024-10-11 ENCOUNTER — Encounter: Payer: Self-pay | Admitting: Family Medicine
